# Patient Record
Sex: Male | Born: 1995 | Race: White | Hispanic: No
Health system: Southern US, Community
[De-identification: ages and names within clinical notes are randomized; demographics above are authoritative.]

---
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
## 2021-09-10 ENCOUNTER — Emergency Department
Admission: EM | Admit: 2021-09-10 | Discharge: 2021-09-10 | Disposition: A | Discharge status: Home / Self Care | Payer: Self-pay | Attending: Emergency Medicine | Admitting: Emergency Medicine

## 2021-09-10 DIAGNOSIS — S61412A Laceration without foreign body of left hand, initial encounter: Secondary | ICD-10-CM | POA: Insufficient documentation

## 2021-09-10 DIAGNOSIS — Z23 Encounter for immunization: Secondary | ICD-10-CM | POA: Insufficient documentation

## 2021-09-10 DIAGNOSIS — Y92009 Unspecified place in unspecified non-institutional (private) residence as the place of occurrence of the external cause: Secondary | ICD-10-CM | POA: Insufficient documentation

## 2021-09-10 DIAGNOSIS — W260XXA Contact with knife, initial encounter: Secondary | ICD-10-CM | POA: Insufficient documentation

## 2021-09-10 MED ORDER — TETANUS-DIPHTH-ACELL PERTUSSIS 5-2.5-18.5 LF-MCG/0.5 IM SUSY
0.5000 mL | PREFILLED_SYRINGE | Freq: Once | INTRAMUSCULAR | Status: AC
  Administered 2021-09-10: 0.5 mL via INTRAMUSCULAR
  Filled 2021-09-10: qty 0.5

## 2021-09-10 MED ORDER — BACITRACIN ZINC 500 UNIT/GM EX OINT
TOPICAL_OINTMENT | CUTANEOUS | Status: AC
  Administered 2021-09-10: 1
  Filled 2021-09-10: qty 0.9

## 2021-09-10 NOTE — ED Provider Notes (Signed)
[facility] Provider Note    Event Date/Time   First MD Initiated Contact with Patient 09/10/21 0327     (approximate)   History   Laceration   HPI  Randy Johnston is a 26 y.o. male who presents to the ED from home with a chief complaint of left hand laceration.  Patient was cutting ice with a cooking knife with his right, dominant hand when it slipped and he cut his left hand.  Tetanus is not up-to-date.  Bleeding controlled.  Denies weakness, numbness or tingling in his left hand.     Past Medical History  None   Active Problem List  There are no problems to display for this patient.    Past Surgical History  None   Home Medications   Prior to Admission medications   Not on File     Allergies  Patient has no known allergies.   Family History  No family history on file.   Physical Exam  Triage Vital Signs: ED Triage Vitals  Enc Vitals Group     BP 09/10/21 0303 117/75     Pulse Rate 09/10/21 0303 85     Resp 09/10/21 0303 18     Temp 09/10/21 0303 99 F (37.2 C)     Temp Source 09/10/21 0303 Oral     SpO2 09/10/21 0303 96 %     Weight --      Height --      Head Circumference --      Peak Flow --      Pain Score 09/10/21 0304 4     Pain Loc --      Pain Edu? --      Excl. in GC? --     Updated Vital Signs: BP 117/75    Pulse 85    Temp 99 F (37.2 C) (Oral)    Resp 18    SpO2 96%    General: Awake, no distress.  CV:  Good peripheral perfusion.  Resp:  Normal effort.  Abd:  No distention.  Other:  0.5 cm vertically linear laceration to webspace between left fourth and fifth digits without active bleeding.  2+ radial pulse.  Brisk, less than 5-second capillary refill.  5/5 motor strength and sensation.   ED Results / Procedures / Treatments  Labs (all labs ordered are listed, but only abnormal results are displayed) Labs Reviewed - No data to display   EKG  None   RADIOLOGY None   Official radiology  report(s): No results found.   PROCEDURES:  Critical Care performed: No  Procedures   MEDICATIONS ORDERED IN ED: Medications  Tdap (BOOSTRIX) injection 0.5 mL (0.5 mLs Intramuscular Given 09/10/21 0343)  bacitracin 500 UNIT/GM ointment (1 application  Given 09/10/21 0345)     IMPRESSION / MDM / ASSESSMENT AND PLAN / ED COURSE  I reviewed the triage vital signs and the nursing notes.                             26 year old male presenting with left hand laceration.  Does not desire sutures which is reasonable given location of laceration would be quite painful to numb for just 1 or 2 sutures.  Will update tetanus, soak hand in saline plus Betadine solution, apply bacitracin and nonadherent dressing.  Strict return precautions given.  Patient verbalizes understanding and agrees with plan of care.   FINAL CLINICAL IMPRESSION(S) / ED  DIAGNOSES   Final diagnoses:  Laceration of left hand without foreign body, initial encounter     Rx / DC Orders   ED Discharge Orders     None        Note:  This document was prepared using Dragon voice recognition software and may include unintentional dictation errors.   Irean Hong, MD 09/10/21 [phone]

## 2021-09-10 NOTE — ED Triage Notes (Signed)
Pt states he was cutting ice with a cooking knife when it slipped and he lacerated his left hand. Bleeding is controlled

## 2021-09-10 NOTE — Discharge Instructions (Signed)
Keep wound clean and dry.  Your tetanus has been updated and will be good for 10 years.  Return to the ER for worsening symptoms, redness/swelling, purulent discharge or other concerns.

## 2022-12-08 DIAGNOSIS — S300XXA Contusion of lower back and pelvis, initial encounter: Secondary | ICD-10-CM | POA: Diagnosis not present

## 2022-12-08 DIAGNOSIS — S3992XA Unspecified injury of lower back, initial encounter: Secondary | ICD-10-CM | POA: Diagnosis not present

## 2022-12-08 DIAGNOSIS — S299XXA Unspecified injury of thorax, initial encounter: Secondary | ICD-10-CM | POA: Diagnosis not present

## 2022-12-08 DIAGNOSIS — S20229A Contusion of unspecified back wall of thorax, initial encounter: Secondary | ICD-10-CM | POA: Diagnosis not present

## 2022-12-08 DIAGNOSIS — S161XXA Strain of muscle, fascia and tendon at neck level, initial encounter: Secondary | ICD-10-CM | POA: Diagnosis not present

## 2022-12-08 DIAGNOSIS — S199XXA Unspecified injury of neck, initial encounter: Secondary | ICD-10-CM | POA: Diagnosis not present

## 2022-12-08 DIAGNOSIS — Y92410 Unspecified street and highway as the place of occurrence of the external cause: Secondary | ICD-10-CM | POA: Diagnosis not present

## 2022-12-08 DIAGNOSIS — W1789XA Other fall from one level to another, initial encounter: Secondary | ICD-10-CM | POA: Diagnosis not present

## 2022-12-08 DIAGNOSIS — S20219A Contusion of unspecified front wall of thorax, initial encounter: Secondary | ICD-10-CM | POA: Diagnosis not present

## 2022-12-08 DIAGNOSIS — S3991XA Unspecified injury of abdomen, initial encounter: Secondary | ICD-10-CM | POA: Diagnosis not present

## 2022-12-08 DIAGNOSIS — S20212A Contusion of left front wall of thorax, initial encounter: Secondary | ICD-10-CM | POA: Diagnosis not present

## 2022-12-08 DIAGNOSIS — S0990XA Unspecified injury of head, initial encounter: Secondary | ICD-10-CM | POA: Diagnosis not present

## 2023-01-24 DIAGNOSIS — H9203 Otalgia, bilateral: Secondary | ICD-10-CM | POA: Diagnosis not present

## 2023-01-24 DIAGNOSIS — H60311 Diffuse otitis externa, right ear: Secondary | ICD-10-CM | POA: Diagnosis not present

## 2023-01-24 DIAGNOSIS — H66003 Acute suppurative otitis media without spontaneous rupture of ear drum, bilateral: Secondary | ICD-10-CM | POA: Diagnosis not present

## 2023-01-24 DIAGNOSIS — H6121 Impacted cerumen, right ear: Secondary | ICD-10-CM | POA: Diagnosis not present
